# Patient Record
Sex: Female | Born: 1985 | Race: White | Hispanic: No | Marital: Married | Smoking: Current some day smoker
Health system: Southern US, Community
[De-identification: ages and names within clinical notes are randomized; demographics above are authoritative.]

## PROBLEM LIST (undated history)

## (undated) DIAGNOSIS — G43909 Migraine, unspecified, not intractable, without status migrainosus: Secondary | ICD-10-CM

## (undated) HISTORY — PX: BACK SURGERY: SHX140

## (undated) HISTORY — PX: CHOLECYSTECTOMY: SHX55

---

## 2018-05-10 ENCOUNTER — Encounter (HOSPITAL_COMMUNITY): Payer: Self-pay | Admitting: Emergency Medicine

## 2018-05-10 ENCOUNTER — Ambulatory Visit (HOSPITAL_COMMUNITY)
Admission: RE | Admit: 2018-05-10 | Discharge: 2018-05-10 | Disposition: A | Discharge status: Home / Self Care | Payer: Self-pay | Attending: Psychiatry | Admitting: Psychiatry

## 2018-05-10 DIAGNOSIS — F329 Major depressive disorder, single episode, unspecified: Secondary | ICD-10-CM | POA: Insufficient documentation

## 2018-05-10 DIAGNOSIS — F1120 Opioid dependence, uncomplicated: Secondary | ICD-10-CM | POA: Insufficient documentation

## 2018-05-10 NOTE — H&P (Addendum)
Behavioral Health Medical Screening Exam  Karen FootKrystal Bush is an 32 y.o. female.  Total Time spent with patient: 20 minutes  Psychiatric Specialty Exam: Physical Exam  Nursing note and vitals reviewed. Constitutional: She is oriented to person, place, and time. She appears well-developed and well-nourished.  Cardiovascular: Normal rate.  Respiratory: Effort normal.  Musculoskeletal: Normal range of motion.  Neurological: She is alert and oriented to person, place, and time.  Skin: Skin is warm.    Review of Systems  Constitutional: Negative.   HENT: Negative.   Eyes: Negative.   Respiratory: Negative.   Cardiovascular: Negative.   Gastrointestinal: Negative.   Genitourinary: Negative.   Musculoskeletal: Negative.   Skin: Negative.   Neurological: Negative.   Endo/Heme/Allergies: Negative.   Psychiatric/Behavioral: The patient is nervous/anxious.     Blood pressure (!) 165/28, pulse 89, temperature 98 F (36.7 C), SpO2 100 %.There is no height or weight on file to calculate BMI.  General Appearance: Disheveled  Eye Contact:  Good  Speech:  Clear and Coherent and Normal Rate  Volume:  Normal  Mood:  Anxious  Affect:  Congruent  Thought Process:  Goal Directed and Descriptions of Associations: Intact  Orientation:  Full (Time, Place, and Person)  Thought Content:  WDL  Suicidal Thoughts:  No  Homicidal Thoughts:  No  Memory:  Immediate;   Good Recent;   Good Remote;   Good  Judgement:  Fair  Insight:  Fair  Psychomotor Activity:  Normal  Concentration: Concentration: Good and Attention Span: Good  Recall:  Good  Fund of Knowledge:Good  Language: Good  Akathisia:  No  Handed:  Right  AIMS (if indicated):     Assets:  Communication Skills Desire for Improvement Social Support Transportation  Sleep:       Musculoskeletal: Strength & Muscle Tone: within normal limits Gait & Station: normal Patient leans: N/A  Blood pressure (!) 165/28, pulse 89,  temperature 98 F (36.7 C), SpO2 100 %.  Recommendations:  Based on my evaluation the patient appears to have an emergency medical condition for which I recommend the patient be transferred to the emergency department for further evaluation. [facility]  Gerlene Burdockravis B Money, FNP 05/10/2018, 6:29 PM   Agree with NP note

## 2018-05-10 NOTE — BH Assessment (Addendum)
Assessment Note  Karen Bush is an 32 y.o. female. She presents voluntarily to [facility] for assessment accompanied by her girlfriend, Unk Pinto. Patient is pleasant but restless. She reports she is withdrawing from heroin. Pt reports she has been snorting heroin or suboxone three times a week. Pt says she moved to [place] from [place] one week ago. She is living with her partner of three months. Link Snuffer is present for assessment. Pt endorses irritability and loss of interest in usual pleasures. Pt denies SI currently or at any time in the past. Pt denies any history of suicide attempts and denies history of self-mutilation. Pt denies homicidal thoughts or physical aggression. Pt denies having access to firearms. Pt denies having any legal problems at this time. Pt denies hallucinations. Pt does not appear to be responding to internal stimuli and exhibits no delusional thought. Pt's reality testing appears to be intact. She reports that she typically has hallucinations which she is withdrawing from heroin. She reports that she hasn't experienced any hallucinations yet. She reports substance abuse on both sides of her family. She says her family in [place] is supportive of her move, so she can be away from her drug abusing friends. Pt says she is "allergic" to [facility] so she never used marijuana. Pt reports she went to treatment once in 2013 in [place]. She says she detoxed in jail and then went straight to month long rehab. Pt reports she has a 39 year old child who lives with child's father.   Diagnosis: Opioid Use Disorder, Severe Unspecified Depressive Disorder  Past Medical History: No past medical history on file.    Family History: No family history on file.  Social History:  reports that she has current or past drug history. Drug: Heroin. She reports that she does not drink alcohol. Her tobacco history is not on file.  Additional Social History:  Alcohol / Drug Use Pain Medications: pt denies abuse -  see pta meds list Prescriptions: pt denies abuse - see pta meds list Over the Counter: pt denies abuse - see pta meds list History of alcohol / drug use?: Yes Negative Consequences of Use: Financial Withdrawal Symptoms: Diarrhea, Fever / Chills, Irritability, Nausea / Vomiting, Patient aware of relationship between substance abuse and physical/medical complications, Sweats Substance #1 Name of Substance 1: heroin/suboxone - snorts the drugs, has never injected 1 - Age of First Use: 17 1 - Amount (size/oz): varies 1 - Frequency: three times a week - tries to use suboxone rather than heroin 1 - Last Use / Amount: 05/10/18 - early am - small amount  CIWA: CIWA-Ar BP: (!) 165/28 Pulse Rate: 89 COWS:    Allergies: Allergies not on file  Home Medications:  (Not in a hospital admission)  OB/GYN Status:  No LMP recorded.  General Assessment Data Location of Assessment: [facility] ED TTS Assessment: In system Is this a Tele or Face-to-Face Assessment?: Face-to-Face Is this an Initial Assessment or a Re-assessment for this encounter?: Initial Assessment Marital status: Long term relationship Maiden name: Temari Aguinaldo Is patient pregnant?: No Pregnancy Status: No Living Arrangements: Spouse/significant other, Non-relatives/Friends(girlfriend, gf's mom, gf's sister, gf's dad) Can pt return to current living arrangement?: Yes Admission Status: Voluntary Is patient capable of signing voluntary admission?: Yes Referral Source: Self/Family/Friend Insurance type: self pay  Medical Screening Exam [facility] Walk-in ONLY) Medical Exam completed: Yes(travis money NP)  Crisis Care Plan Living Arrangements: Spouse/significant other, Non-relatives/Friends(girlfriend, gf's mom, gf's sister, gf's dad) Legal Guardian: (herself) Name of Psychiatrist:  none Name of Therapist: none  Education Status Is patient currently in school?: No Is the patient employed, unemployed or receiving disability?:  Unemployed  Risk to self with the past 6 months Suicidal Ideation: No Has patient been a risk to self within the past 6 months prior to admission? : No Suicidal Intent: No Has patient had any suicidal intent within the past 6 months prior to admission? : No Is patient at risk for suicide?: No Suicidal Plan?: No Has patient had any suicidal plan within the past 6 months prior to admission? : No Access to Means: No What has been your use of drugs/alcohol within the last 12 months?: pt reports heroin and suboxone abuse Previous Attempts/Gestures: No How many times?: 0 Other Self Harm Risks: none Triggers for Past Attempts: (n/a) Intentional Self Injurious Behavior: None Family Suicide History: No Recent stressful life event(s): Other (Comment), Financial Problems(substance abuse) Persecutory voices/beliefs?: No Depression: Yes Depression Symptoms: Feeling angry/irritable, Loss of interest in usual pleasures Substance abuse history and/or treatment for substance abuse?: Yes Suicide prevention information given to non-admitted patients: Not applicable  Risk to Others within the past 6 months Homicidal Ideation: No Does patient have any lifetime risk of violence toward others beyond the six months prior to admission? : No Thoughts of Harm to Others: No Current Homicidal Intent: No Current Homicidal Plan: No Access to Homicidal Means: No Identified Victim: none History of harm to others?: No Assessment of Violence: None Noted Violent Behavior Description: pt denies history of violence Does patient have access to weapons?: No Criminal Charges Pending?: No Does patient have a court date: No Is patient on probation?: No  Psychosis Hallucinations: None noted(only when withdrawing) Delusions: None noted  Mental Status Report Appearance/Hygiene: Unremarkable(in clothing appropriate for weather) Eye Contact: Fair Motor Activity: Freedom of movement, Restlessness Speech:  Logical/coherent Level of Consciousness: Alert, Restless Mood: Anxious, Irritable Affect: Anxious, Appropriate to circumstance Anxiety Level: Moderate Thought Processes: Relevant, Coherent Judgement: Partial(due to withdrawal symptoms) Orientation: Person, Place, Time, Situation Obsessive Compulsive Thoughts/Behaviors: None  Cognitive Functioning Concentration: Normal Memory: Recent Intact, Remote Intact Is patient IDD: No Is patient DD?: No Insight: Poor Impulse Control: Poor Appetite: Poor Have you had any weight changes? : No Change Sleep: No Change Total Hours of Sleep: 5 Vegetative Symptoms: None  ADLScreening [facility] Assessment Services) Patient's cognitive ability adequate to safely complete daily activities?: Yes Patient able to express need for assistance with ADLs?: Yes Independently performs ADLs?: Yes (appropriate for developmental age)  Prior Inpatient Therapy Prior Inpatient Therapy: Yes Prior Therapy Dates: 2013 Prior Therapy Facilty/Provider(s): in [place] Reason for Treatment: substance abuse treatment  Prior Outpatient Therapy Prior Outpatient Therapy: No Does patient have an ACCT team?: No Does patient have Intensive In-House Services?  : No Does patient have [facility] services? : No Does patient have P4CC services?: No  ADL Screening (condition at time of admission) Patient's cognitive ability adequate to safely complete daily activities?: Yes Is the patient deaf or have difficulty hearing?: No Does the patient have difficulty seeing, even when wearing glasses/contacts?: No Does the patient have difficulty concentrating, remembering, or making decisions?: No Patient able to express need for assistance with ADLs?: Yes Does the patient have difficulty dressing or bathing?: No Independently performs ADLs?: Yes (appropriate for developmental age) Does the patient have difficulty walking or climbing stairs?: No Weakness of Legs: None Weakness of  Arms/Hands: None  Home Assistive Devices/Equipment Home Assistive Devices/Equipment: None    Abuse/Neglect Assessment (Assessment to be complete while  patient is alone) Abuse/Neglect Assessment Can Be Completed: Yes Physical Abuse: Denies Verbal Abuse: Denies Sexual Abuse: Denies Exploitation of patient/patient's resources: Denies Self-Neglect: Denies     Merchant navy officer (For Healthcare) Does Patient Have a Medical Advance Directive?: No Would patient like information on creating a medical advance directive?: No - Patient declined    Additional Information 1:1 In Past 12 Months?: No CIRT Risk: No Elopement Risk: No Does patient have medical clearance?: No     Disposition:  Disposition Initial Assessment Completed for this Encounter: Yes Disposition of Patient: Discharge(travis money NP recommends discharge but follow up at ED for)   Ran pt by Reola Calkins NP who agreed with writer that pt does not meet inpatient criteria. Writer discussed [facility] CD IOP with pt and pt is very interested. Writer gave pt contact info for CD IOP and pt gave Clinical research associate permission to email [facility] CD IOP provider. Money NP recommends pt to go to the ED due to pt's BP. Pt tells writer that she doesn't want to sit in an ED for several hours while she is experiencing withdrawal symptoms. Pt is pleasant and appreciative of info given. PT says she is going home with her girlfriend.   On Site Evaluation by:   Reviewed with Physician:    Thornell Sartorius 05/10/2018 7:37 PM

## 2018-05-13 ENCOUNTER — Telehealth (HOSPITAL_COMMUNITY): Payer: Self-pay | Admitting: Psychology

## 2019-01-20 ENCOUNTER — Telehealth: Payer: Self-pay | Admitting: Internal Medicine

## 2019-01-20 NOTE — Telephone Encounter (Signed)
Pls call pt back [phone] she is wanting to be a new patient in the [facility]

## 2019-01-20 NOTE — Telephone Encounter (Signed)
Returned call to patient. No answer. Left message on VM requesting return call. L. Kayla Weekes, RN, BSN     

## 2019-01-21 NOTE — Telephone Encounter (Signed)
Attempted to rtc, no answer, no vmail picked up

## 2019-07-16 ENCOUNTER — Emergency Department (HOSPITAL_COMMUNITY): Payer: Self-pay

## 2019-07-16 ENCOUNTER — Emergency Department (HOSPITAL_COMMUNITY)
Admission: EM | Admit: 2019-07-16 | Discharge: 2019-07-16 | Disposition: A | Discharge status: Home / Self Care | Payer: Self-pay | Attending: Emergency Medicine | Admitting: Emergency Medicine

## 2019-07-16 ENCOUNTER — Other Ambulatory Visit: Payer: Self-pay

## 2019-07-16 ENCOUNTER — Encounter (HOSPITAL_COMMUNITY): Payer: Self-pay

## 2019-07-16 DIAGNOSIS — Y999 Unspecified external cause status: Secondary | ICD-10-CM | POA: Insufficient documentation

## 2019-07-16 DIAGNOSIS — S61411A Laceration without foreign body of right hand, initial encounter: Secondary | ICD-10-CM | POA: Insufficient documentation

## 2019-07-16 DIAGNOSIS — Y92019 Unspecified place in single-family (private) house as the place of occurrence of the external cause: Secondary | ICD-10-CM | POA: Insufficient documentation

## 2019-07-16 DIAGNOSIS — S60511A Abrasion of right hand, initial encounter: Secondary | ICD-10-CM | POA: Insufficient documentation

## 2019-07-16 DIAGNOSIS — W540XXA Bitten by dog, initial encounter: Secondary | ICD-10-CM | POA: Insufficient documentation

## 2019-07-16 DIAGNOSIS — Z23 Encounter for immunization: Secondary | ICD-10-CM | POA: Insufficient documentation

## 2019-07-16 DIAGNOSIS — S50811A Abrasion of right forearm, initial encounter: Secondary | ICD-10-CM | POA: Insufficient documentation

## 2019-07-16 DIAGNOSIS — Y93K9 Activity, other involving animal care: Secondary | ICD-10-CM | POA: Insufficient documentation

## 2019-07-16 DIAGNOSIS — F17200 Nicotine dependence, unspecified, uncomplicated: Secondary | ICD-10-CM | POA: Insufficient documentation

## 2019-07-16 DIAGNOSIS — S51811A Laceration without foreign body of right forearm, initial encounter: Secondary | ICD-10-CM | POA: Insufficient documentation

## 2019-07-16 MED ORDER — METRONIDAZOLE 500 MG PO TABS: 500.0000 mg | ORAL_TABLET | Freq: Three times a day (TID) | ORAL | 0 refills | Status: AC

## 2019-07-16 MED ORDER — DOXYCYCLINE HYCLATE 100 MG PO TABS
100.0000 mg | ORAL_TABLET | Freq: Once | ORAL | Status: AC
  Administered 2019-07-16: 19:00:00 100 mg via ORAL
  Filled 2019-07-16: qty 1

## 2019-07-16 MED ORDER — METRONIDAZOLE 500 MG PO TABS
500.0000 mg | ORAL_TABLET | Freq: Once | ORAL | Status: AC
  Administered 2019-07-16: 19:00:00 500 mg via ORAL
  Filled 2019-07-16: qty 1

## 2019-07-16 MED ORDER — IBUPROFEN 600 MG PO TABS: 600.0000 mg | ORAL_TABLET | Freq: Four times a day (QID) | ORAL | 0 refills | Status: DC | PRN

## 2019-07-16 MED ORDER — DOXYCYCLINE HYCLATE 100 MG PO CAPS: 100.0000 mg | ORAL_CAPSULE | Freq: Two times a day (BID) | ORAL | 0 refills | Status: AC

## 2019-07-16 MED ORDER — OXYCODONE-ACETAMINOPHEN 5-325 MG PO TABS
2.0000 | ORAL_TABLET | Freq: Once | ORAL | Status: AC
  Administered 2019-07-16: 19:00:00 2 via ORAL
  Filled 2019-07-16: qty 2

## 2019-07-16 MED ORDER — TETANUS-DIPHTH-ACELL PERTUSSIS 5-2.5-18.5 LF-MCG/0.5 IM SUSP
0.5000 mL | Freq: Once | INTRAMUSCULAR | Status: AC
  Administered 2019-07-16: 0.5 mL via INTRAMUSCULAR
  Filled 2019-07-16: qty 0.5

## 2019-07-16 NOTE — ED Notes (Signed)
Pt given coke and sandwich bag, ok by EPD

## 2019-07-16 NOTE — ED Triage Notes (Signed)
To triage via EMS.  Onset last night SO husky bit pt on right forearm and hand.  Dog is not up to date on shots.  Animal control on way to pts home.   EMS initial BP 180/140, 180/120 HR 120 Last EMS BP 146/110 HR 100 RR 24 SpO2 97% room air.  EMS gave Fentanyl 200 mcg.

## 2019-07-16 NOTE — ED Provider Notes (Signed)
[facility] EMERGENCY [facility]MENT Provider Note   CSN: [id number] Arrival date & time: 07/16/19  1528    History   Chief Complaint Chief Complaint  Patient presents with  . Animal Bite    HPI Karen Bush is a 33 y.o. female with history of substance abuse who presents to the ED complaining of a dog bite to her right hand and forearm yesterday evening around 8 PM.  Patient reports that her wife's husky bit her when she tried to break up a fight between the dog and her pit bull.  Patient reports she has called animal control and they have taken the dog into custody to observe for signs of rabies.  Patient reports associated pain and swelling of her right hand that has been worsening since this morning.  She reports associated decreased range of motion secondary to pain.  She denies any fevers, drainage from the wound, or any other complaints.     The history is provided by the patient.    History reviewed. No pertinent past medical history.  There are no active problems to display for this patient.   History reviewed. No pertinent surgical history.   OB History   No obstetric history on file.      Home Medications    Prior to Admission medications   Medication Sig Start Date End Date Taking? Authorizing Provider  doxycycline (VIBRAMYCIN) 100 MG capsule Take 1 capsule (100 mg total) by mouth 2 (two) times daily for 10 days. 07/16/19 07/26/19  Garry HeaterEames, Conall Vangorder, MD  ibuprofen (ADVIL) 600 MG tablet Take 1 tablet (600 mg total) by mouth every 6 (six) hours as needed. 07/16/19   Garry HeaterEames, Javin Nong, MD  metroNIDAZOLE (FLAGYL) 500 MG tablet Take 1 tablet (500 mg total) by mouth 3 (three) times daily for 10 days. 07/16/19 07/26/19  Garry HeaterEames, Carolyna Yerian, MD    Family History History reviewed. No pertinent family history.  Social History Social History   Tobacco Use  . Smoking status: Current Some Day Smoker    Types: E-cigarettes  . Smokeless tobacco: Never Used  . Tobacco  comment: 1 pack cigarettes per week/vapes now  Substance Use Topics  . Alcohol use: Never    Frequency: Never  . Drug use: Not Currently    Types: Heroin     Allergies   Penicillins   Review of Systems Review of Systems  Constitutional: Negative for chills and fever.  HENT: Negative for ear pain and sore throat.   Eyes: Negative for pain and visual disturbance.  Respiratory: Negative for cough and shortness of breath.   Cardiovascular: Negative for chest pain and palpitations.  Gastrointestinal: Negative for abdominal pain and vomiting.  Genitourinary: Negative for dysuria and hematuria.  Musculoskeletal: Negative for arthralgias and back pain.  Skin: Positive for wound. Negative for color change and rash.  Neurological: Negative for seizures and syncope.  All other systems reviewed and are negative.    Physical Exam Updated Vital Signs BP (!) 165/84   Pulse (!) 102   Temp 98.3 F (36.8 C) (Oral)   Resp 19   Wt 70.3 kg   SpO2 98%   Physical Exam Vitals signs and nursing note reviewed.  Constitutional:      General: She is not in acute distress.    Appearance: She is well-developed and normal weight. She is not ill-appearing, toxic-appearing or diaphoretic.  HENT:     Head: Normocephalic and atraumatic.  Eyes:     Extraocular Movements: Extraocular movements intact.  Conjunctiva/sclera: Conjunctivae normal.  Neck:     Musculoskeletal: Normal range of motion and neck supple. No neck rigidity or muscular tenderness.  Cardiovascular:     Rate and Rhythm: Regular rhythm. Tachycardia present.     Pulses: Normal pulses.     Heart sounds: Normal heart sounds. No murmur. No friction rub. No gallop.   Pulmonary:     Effort: Pulmonary effort is normal. No respiratory distress.     Breath sounds: Normal breath sounds. No stridor. No wheezing or rhonchi.  Abdominal:     Palpations: Abdomen is soft.     Tenderness: There is no abdominal tenderness.  Musculoskeletal:         General: Swelling and signs of injury present.     Comments: Multiple superficial lacerations to right hand and forearm with associated swelling over the dorsum of the second and third metacarpals.   There is mild associated erythema.  Patient has decreased range of motion of the second and third digits secondary to pain and has diffuse tenderness to palpation of the hand and first and second fingers.  She has intact capillary refill in all fingers of the right hand.  Skin:    General: Skin is warm and dry.     Findings: Lesion present.  Neurological:     General: No focal deficit present.     Mental Status: She is alert and oriented to person, place, and time. Mental status is at baseline.     Cranial Nerves: No cranial nerve deficit.     Sensory: No sensory deficit.     Motor: No weakness.      ED Treatments / Results  Labs (all labs ordered are listed, but only abnormal results are displayed) Labs Reviewed - No data to display  EKG None  Radiology Dg Hand 2 View Right  Result Date: 07/16/2019 CLINICAL DATA:  Dog bite to right forearm and hand. Lateral right hand about the fifth metacarpal. EXAM: RIGHT HAND - 2 VIEW COMPARISON:  None. FINDINGS: Geometric radiodensities are present in the soft tissues along the lateral aspect of the right hand which could reflect debris versus foreign body. There is no evidence of fracture or dislocation. There is no evidence of arthropathy or other focal bone abnormality. No subcutaneous gas. IMPRESSION: Radiodensities in the soft tissues along the lateral aspect of the right hand could reflect debris versus foreign body, correlate with visual inspection. Electronically Signed   By: Lovena Le M.D.   On: 07/16/2019 18:40    Procedures Procedures (including critical care time)  Medications Ordered in ED Medications  Tdap (BOOSTRIX) injection 0.5 mL (0.5 mLs Intramuscular Given 07/16/19 1757)  oxyCODONE-acetaminophen (PERCOCET/ROXICET) 5-325  MG per tablet 2 tablet (2 tablets Oral Given 07/16/19 1837)  doxycycline (VIBRA-TABS) tablet 100 mg (100 mg Oral Given 07/16/19 1852)  metroNIDAZOLE (FLAGYL) tablet 500 mg (500 mg Oral Given 07/16/19 1852)     Initial Impression / Assessment and Plan / ED Course  I have reviewed the triage vital signs and the nursing notes.  Pertinent labs & imaging results that were available during my care of the patient were reviewed by me and considered in my medical decision making (see chart for details).        Karen Bush is a 33 y.o. female with history of substance abuse who presents to the ED complaining of a dog bite to her right hand and forearm yesterday evening around 8 PM with worsening pain and swelling of the hand since  then.  On exam, patient has diffuse swelling to the right hand worst over the dorsum of the second and third metacarpals.  She has multiple lacerations that are superficial, near 24 hours old, and contaminated, which do not require primary closure.  There is no obvious sign of superimposed infection on exam.  X-ray of the right hand showed no evidence of underlying fractures or soft tissue gas.  Patient's tetanus immunization was updated in the ED.  Patient was initiated on antibiotic prophylaxis with doxycycline and metronidazole as she cannot take Augmentin due to her penicillin allergy.  She was given the number to follow-up with a hand specialist if her swelling does not improve.  She was advised to return to the ED for any worsening of symptoms or if she develops signs of infection.  She was advised to apply ice to the extremity and elevate it at home.  Final Clinical Impressions(s) / ED Diagnoses   Final diagnoses:  Dog bite, initial encounter    ED Discharge Orders         Ordered    doxycycline (VIBRAMYCIN) 100 MG capsule  2 times daily     07/16/19 2018    metroNIDAZOLE (FLAGYL) 500 MG tablet  3 times daily     07/16/19 2018    ibuprofen (ADVIL) 600 MG  tablet  Every 6 hours PRN     07/16/19 2018           Garry HeaterEames, Ellias Mcelreath, MD 07/17/19 [id number]    Marily MemosMesner, Jason, MD 07/18/19 [phone]

## 2019-07-16 NOTE — ED Notes (Signed)
Patient transported to x-ray. ?

## 2019-07-16 NOTE — ED Notes (Signed)
ED Provider at bedside. 

## 2020-01-04 ENCOUNTER — Encounter (HOSPITAL_BASED_OUTPATIENT_CLINIC_OR_DEPARTMENT_OTHER): Payer: Self-pay

## 2020-01-04 ENCOUNTER — Other Ambulatory Visit: Payer: Self-pay

## 2020-01-04 ENCOUNTER — Emergency Department (HOSPITAL_BASED_OUTPATIENT_CLINIC_OR_DEPARTMENT_OTHER)
Admission: EM | Admit: 2020-01-04 | Discharge: 2020-01-04 | Disposition: A | Discharge status: Home / Self Care | Payer: Medicaid Other | Attending: Emergency Medicine | Admitting: Emergency Medicine

## 2020-01-04 DIAGNOSIS — M5442 Lumbago with sciatica, left side: Secondary | ICD-10-CM

## 2020-01-04 DIAGNOSIS — F17290 Nicotine dependence, other tobacco product, uncomplicated: Secondary | ICD-10-CM | POA: Insufficient documentation

## 2020-01-04 DIAGNOSIS — Z880 Allergy status to penicillin: Secondary | ICD-10-CM | POA: Insufficient documentation

## 2020-01-04 HISTORY — DX: Migraine, unspecified, not intractable, without status migrainosus: G43.909

## 2020-01-04 LAB — PREGNANCY, URINE: Preg Test, Ur: NEGATIVE

## 2020-01-04 MED ORDER — KETOROLAC TROMETHAMINE 60 MG/2ML IM SOLN
60.0000 mg | Freq: Once | INTRAMUSCULAR | Status: AC
  Administered 2020-01-04: 60 mg via INTRAMUSCULAR
  Filled 2020-01-04: qty 2

## 2020-01-04 MED ORDER — CYCLOBENZAPRINE HCL 10 MG PO TABS: 10.0000 mg | ORAL_TABLET | Freq: Two times a day (BID) | ORAL | 0 refills | Status: AC | PRN

## 2020-01-04 MED ORDER — IBUPROFEN 800 MG PO TABS: 800.0000 mg | ORAL_TABLET | Freq: Three times a day (TID) | ORAL | 0 refills | Status: AC | PRN

## 2020-01-04 NOTE — ED Triage Notes (Signed)
Pt arrives ambulatory to ED with reports of back pain X1 week after picking up her 80 lb dog.

## 2020-01-04 NOTE — ED Provider Notes (Signed)
[facility] EMERGENCY DEPARTMENT Provider Note   CSN: [id number] Arrival date & time: 01/04/20  1047     History Chief Complaint  Patient presents with  . Back Pain    Karen Bush is a 34 y.o. female with PMH of substance abuse and lumbar surgery 16 years ago to repair a herniated disc who presents to the ED with a 1 week history of low back pain.  Patient reports that she had bent over and picked up her 80 lb dog which precipitated her symptoms of discomfort.  Currently her pain is about 8 out of 10 and worse with any bending, lifting, or twisting.  She reports that her left-sided lumbar discomfort radiates down into her left buttock and occasionally down towards her knee.  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  She reports chronic mild left leg numbness secondary to her prior lumbar surgery.  She denies any fevers or chills, chest pain or difficulty breathing, abdominal pain, nausea or vomiting, dysuria, increased urinary frequency, personal history of malignancy, pain worse at night, saddle anesthesia, incontinence, new numbness, weakness, urinary retention, or other neurologic deficits.   HPI     Past Medical History:  Diagnosis Date  . Migraines     There are no problems to display for this patient.   Past Surgical History:  Procedure Laterality Date  . BACK SURGERY    . CHOLECYSTECTOMY       OB History   No obstetric history on file.     No family history on file.  Social History   Tobacco Use  . Smoking status: Current Some Day Smoker    Types: E-cigarettes  . Smokeless tobacco: Never Used  . Tobacco comment: 1 pack cigarettes per week/vapes now  Substance Use Topics  . Alcohol use: Never  . Drug use: Not Currently    Types: Heroin    Home Medications Prior to Admission medications   Medication Sig Start Date End Date  Taking? Authorizing Provider  cyclobenzaprine (FLEXERIL) 10 MG tablet Take 1 tablet (10 mg total) by mouth 2 (two) times daily as needed for muscle spasms. 01/04/20   Lorelee New, PA-C  ibuprofen (ADVIL) 800 MG tablet Take 1 tablet (800 mg total) by mouth 3 (three) times daily between meals as needed for moderate pain. 01/04/20   Lorelee New, PA-C    Allergies    Penicillins  Review of Systems   Review of Systems  Musculoskeletal: Positive for back pain.  Skin: Negative for color change.  Neurological: Negative for weakness.    Physical Exam Updated Vital Signs BP (!) 143/110 (BP Location: Right Arm)   Pulse 99   Temp 97.8 F (36.6 C) (Oral)   Resp 20   Ht 5\' 5"  (1.651 m)   Wt 72.6 kg   LMP 12/08/2019   SpO2 100%   BMI 26.63 kg/m   Physical Exam Vitals and nursing note reviewed. Exam conducted with a chaperone present.  Constitutional:      Appearance: Normal appearance.  HENT:     Head: Normocephalic and atraumatic.  Eyes:     General: No scleral icterus.    Conjunctiva/sclera: Conjunctivae normal.  Cardiovascular:     Rate and Rhythm: Normal rate and regular rhythm.     Pulses: Normal pulses.     Heart sounds: Normal heart sounds.  Pulmonary:     Effort: Pulmonary effort is normal. No respiratory distress.     Breath sounds: Normal breath sounds.  Abdominal:     General: Abdomen is flat. There is no distension.     Palpations: Abdomen is soft.     Tenderness: There is no abdominal tenderness. There is no guarding.  Musculoskeletal:        General: Normal range of motion.     Cervical back: Normal range of motion and neck supple. No rigidity.     Comments: Spine: No cervical, thoracic, lumbar, or sacral TTP.  Moderate TTP left side of spine lumbar region.  No CVA tenderness.  No overlying skin changes.  No palpable masses appreciated.  Negative right-sided SLR, positive left-sided SLR with radiation into buttock. Left leg: Chronically mildly diminished  sensation distally.  Distal pulses and cap refill intact.  ROM and strength fully intact.  Mildly atrophied when compared to right leg.  No overlying skin changes.  Skin:    General: Skin is dry.  Neurological:     General: No focal deficit present.     Mental Status: She is alert and oriented to person, place, and time.     GCS: GCS eye subscore is 4. GCS verbal subscore is 5. GCS motor subscore is 6.     Cranial Nerves: No cranial nerve deficit.     Motor: No weakness.     Gait: Gait normal.  Psychiatric:        Mood and Affect: Mood normal.        Behavior: Behavior normal.        Thought Content: Thought content normal.     ED Results / Procedures / Treatments   Labs (all labs ordered are listed, but only abnormal results are displayed) Labs Reviewed  PREGNANCY, URINE    EKG None  Radiology No results found.  Procedures Procedures (including critical care time)  Medications Ordered in ED Medications  ketorolac (TORADOL) injection 60 mg (60 mg Intramuscular Given 01/04/20 1144)    ED Course  I have reviewed the triage vital signs and the nursing notes.  Pertinent labs & imaging results that were available during my care of the patient were reviewed by me and considered in my medical decision making (see chart for details).    MDM Rules/Calculators/A&P                      Patient has a clear mechanism of injury for her left-sided low back discomfort with unilateral radiculopathy.  Suspect musculoskeletal etiology likely left-sided lumbar strain.  Positive left-sided SLR.  Urine pregnancy was negative here in the ED.  Do not feel as though UA was warranted as patient denies any dysuria or increased urinary frequency.  There was no CVA tenderness on my examination and she denies any fevers or chills.  Given low suspicion for any acute bony abnormalities, discussed with patient as to why plain films of lumbar spine unlikely to yield any significant findings here today.   Despite history of abuse, no history of IVDA and no fevers, significant midline tenderness, or concerning overlying skin changes on physical exam that might otherwise be concerning for epidural abscess.  Will treat here in the ED with Toradol 60 mg IM.  Will prescribe ibuprofen 800 mg 3 times daily as needed for her inflammation discomfort.  Will also prescribe short course of Flexeril for muscle spasms.  No red flags concerning patient's back pain. No s/s  of central cord compression or cauda equina. Lower extremities are neurovascularly intact and patient is ambulating without difficulty.  On reexamination, patient feels improved after Toradol.  Patient to follow-up with her primary care provider regarding today's encounter.  We will also refer her to sports medicine here for ongoing evaluation management of her lumbar strain with radicular symptoms.  Patient voiced understanding and is agreeable to the plan.   Final Clinical Impression(s) / ED Diagnoses Final diagnoses:  Acute left-sided low back pain with left-sided sciatica    Rx / DC Orders ED Discharge Orders         Ordered    ibuprofen (ADVIL) 800 MG tablet  3 times daily between meals PRN     01/04/20 1135    cyclobenzaprine (FLEXERIL) 10 MG tablet  2 times daily PRN     01/04/20 1135           Corena Herter, PA-C 01/04/20 1150    Long, [facility], MD 01/04/20 1935

## 2020-01-04 NOTE — Discharge Instructions (Addendum)
Please take your medications, as prescribed. You were given a prescription for Flexeril which is a muscle relaxer.  You should not drive, work, consume alcohol, or operate machinery while taking this medication as it can make you very drowsy.    Please follow-up with your primary care provider regarding today's encounter.  You may also follow-up with Dr. Jordan Likes, sports medicine physician here at [facility], for ongoing evaluation and management of your lumbar strain with left-sided radiculopathy.  Please return to the ED or seek immediate medical attention for any new or worsening symptoms.  Please read the attachment on rehabilitation for sciatica.

## 2020-07-22 ENCOUNTER — Other Ambulatory Visit: Payer: Self-pay

## 2020-07-22 ENCOUNTER — Emergency Department (HOSPITAL_BASED_OUTPATIENT_CLINIC_OR_DEPARTMENT_OTHER)
Admission: EM | Admit: 2020-07-22 | Discharge: 2020-07-22 | Discharge status: Against Medical Advice | Payer: Medicaid Other

## 2020-09-08 IMAGING — DX RIGHT HAND - 2 VIEW
2 series · 2 of 2 positions shown · non-contrast
Comparison: None.

CLINICAL DATA: Dog bite to right forearm and hand. Lateral right
hand about the fifth metacarpal.

EXAM:
RIGHT HAND - 2 VIEW

[hand pa]
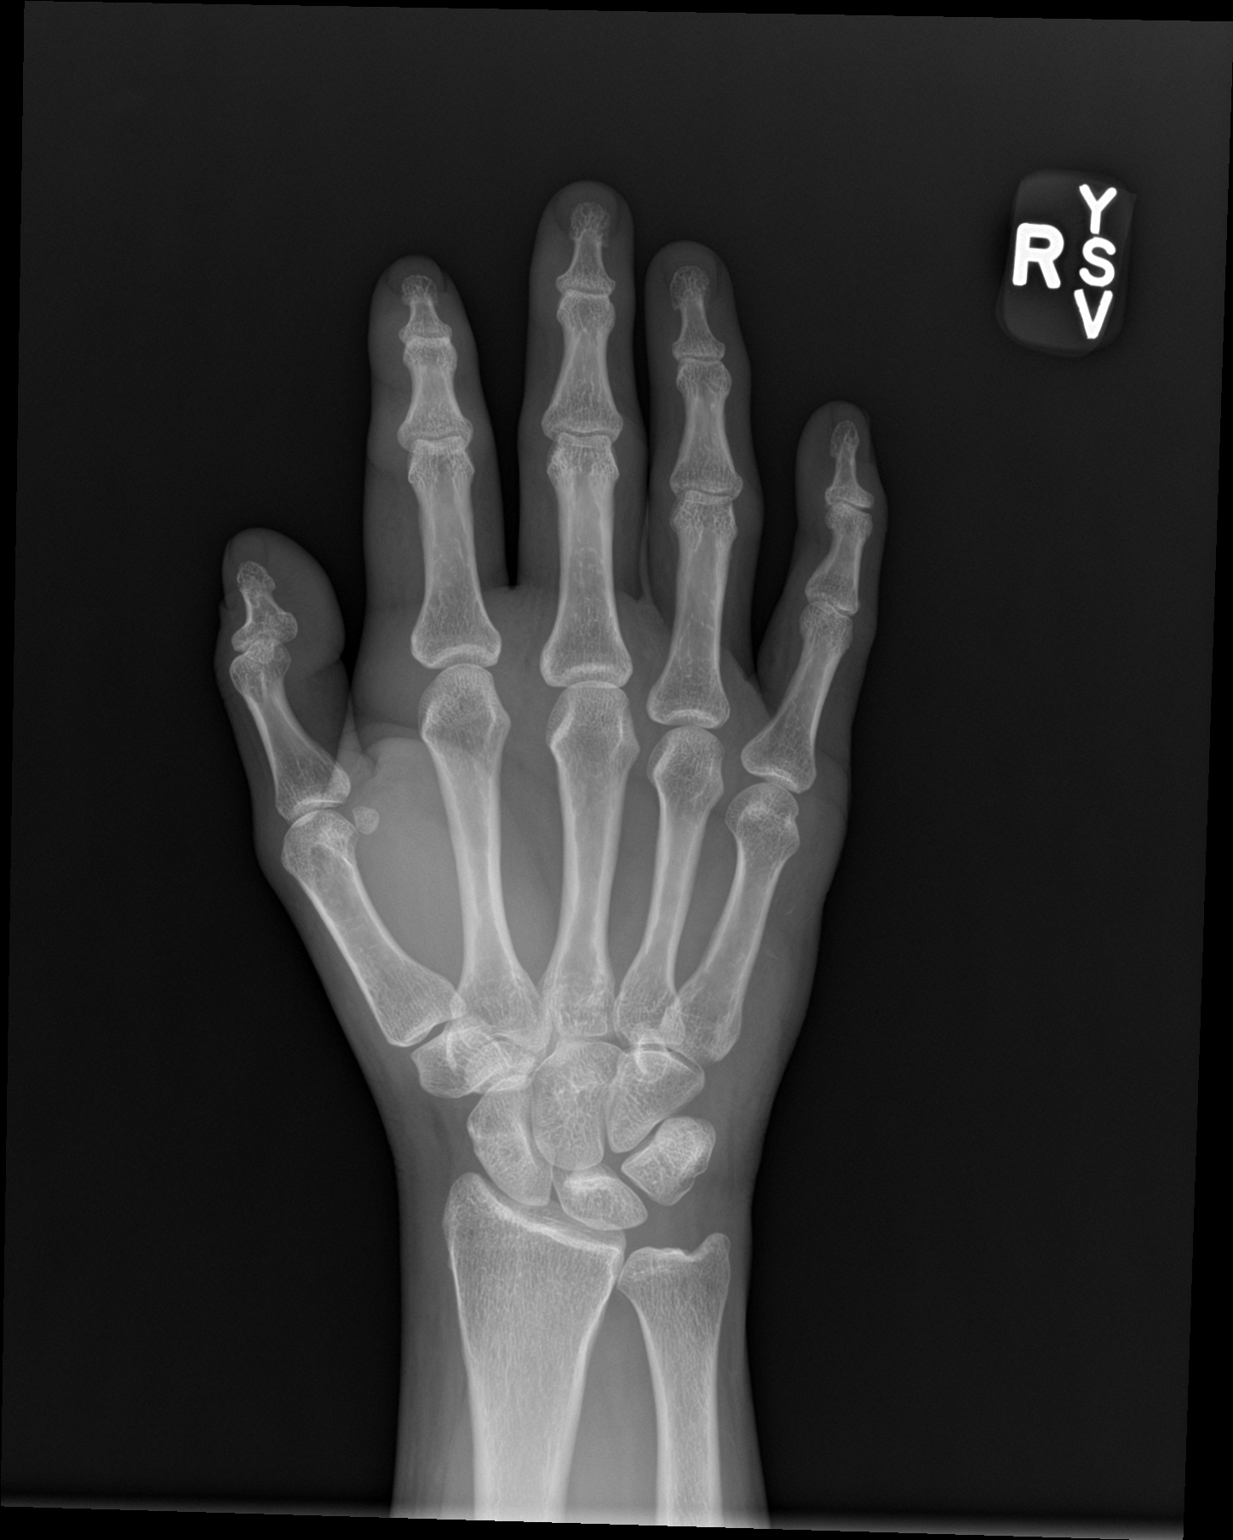

[hand lat]
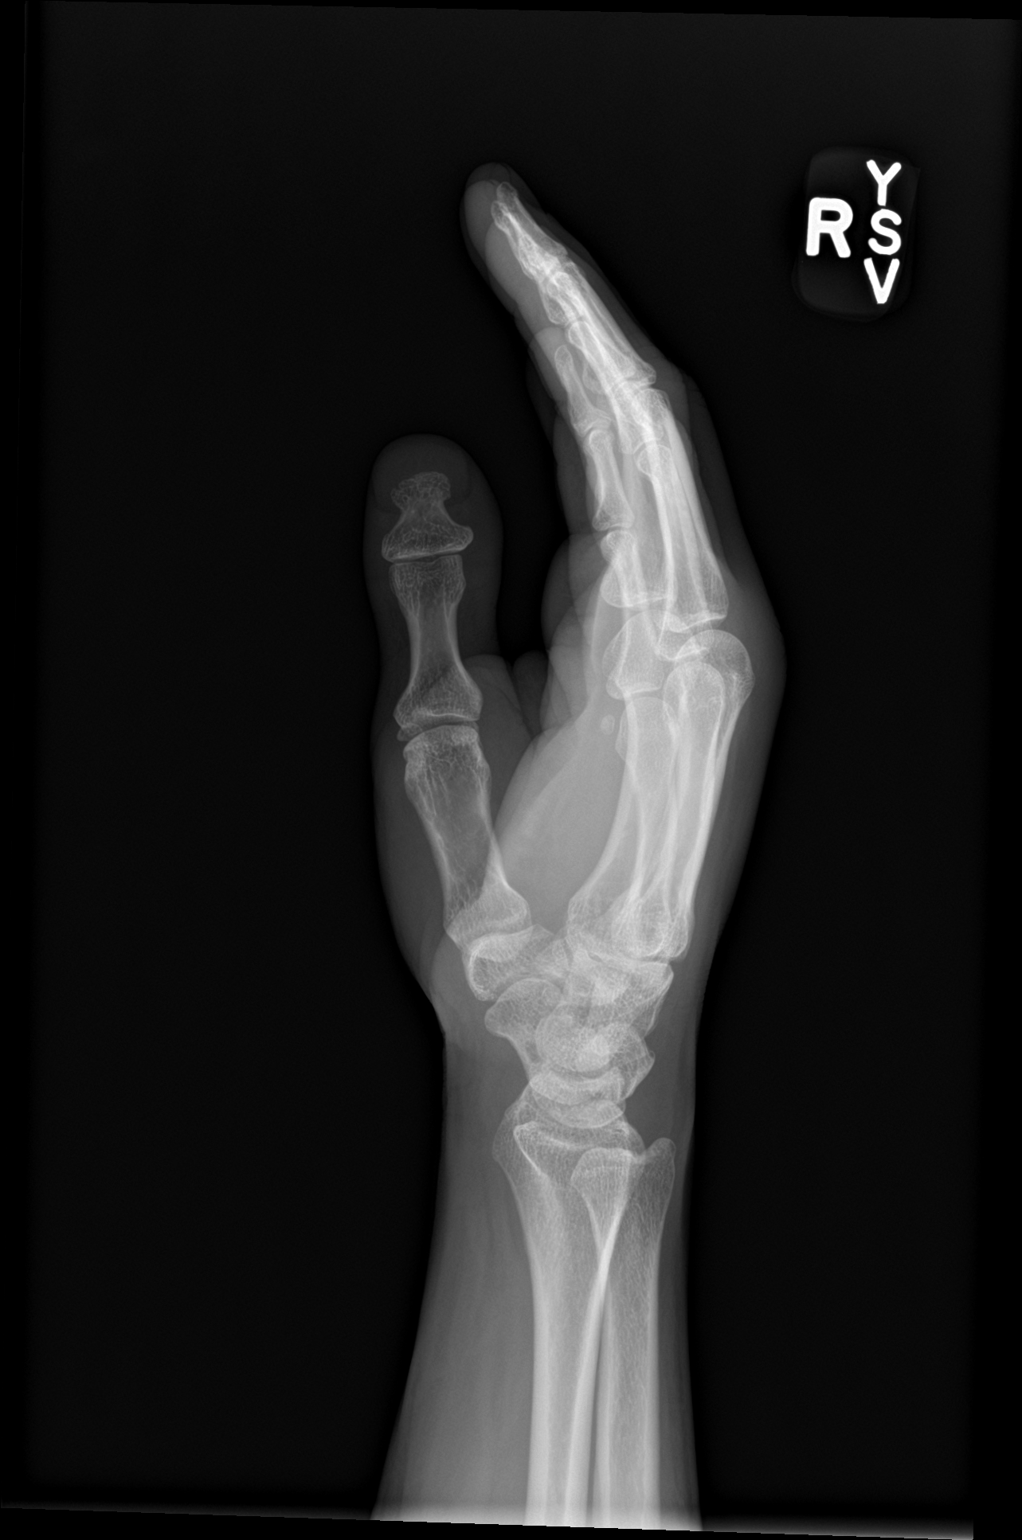

[2 of 2 positions shown; findings below may reference images not displayed]

FINDINGS: Geometric radiodensities are present in the soft tissues along the
lateral aspect of the right hand which could reflect debris versus
foreign body. There is no evidence of fracture or dislocation. There
is no evidence of arthropathy or other focal bone abnormality. No
subcutaneous gas.
IMPRESSION: Radiodensities in the soft tissues along the lateral aspect of the
right hand could reflect debris versus foreign body, correlate with
visual inspection.
